# Patient Record
Sex: Male | Born: 1985 | Race: Black or African American | Hispanic: No | Marital: Single | Smoking: Current every day smoker
Health system: Southern US, Community
[De-identification: ages and names within clinical notes are randomized; demographics above are authoritative.]

---

## 2013-08-14 ENCOUNTER — Emergency Department: Payer: Self-pay | Admitting: Emergency Medicine

## 2013-10-12 ENCOUNTER — Emergency Department: Payer: Self-pay | Admitting: Emergency Medicine

## 2013-10-17 ENCOUNTER — Emergency Department: Payer: Self-pay | Admitting: Emergency Medicine

## 2013-10-18 ENCOUNTER — Emergency Department: Payer: Self-pay | Admitting: Emergency Medicine

## 2013-10-21 LAB — BETA STREP CULTURE(ARMC)

## 2013-11-14 ENCOUNTER — Emergency Department: Payer: Self-pay | Admitting: Emergency Medicine

## 2014-02-22 ENCOUNTER — Emergency Department: Payer: Self-pay | Admitting: Emergency Medicine

## 2014-07-12 ENCOUNTER — Emergency Department: Payer: Self-pay | Admitting: Emergency Medicine

## 2015-08-11 ENCOUNTER — Emergency Department
Admission: EM | Admit: 2015-08-11 | Discharge: 2015-08-11 | Disposition: A | Discharge status: Home / Self Care | Payer: Self-pay | Attending: Student | Admitting: Student

## 2015-08-11 ENCOUNTER — Encounter: Payer: Self-pay | Admitting: Emergency Medicine

## 2015-08-11 DIAGNOSIS — F17210 Nicotine dependence, cigarettes, uncomplicated: Secondary | ICD-10-CM | POA: Insufficient documentation

## 2015-08-11 DIAGNOSIS — K029 Dental caries, unspecified: Secondary | ICD-10-CM | POA: Insufficient documentation

## 2015-08-11 DIAGNOSIS — K0889 Other specified disorders of teeth and supporting structures: Secondary | ICD-10-CM | POA: Insufficient documentation

## 2015-08-11 MED ORDER — LIDOCAINE VISCOUS 2 % MT SOLN: 5.0000 mL | Freq: Four times a day (QID) | OROMUCOSAL | Status: AC | PRN

## 2015-08-11 MED ORDER — TRAMADOL HCL 50 MG PO TABS: 50.0000 mg | ORAL_TABLET | Freq: Four times a day (QID) | ORAL | Status: AC | PRN

## 2015-08-11 MED ORDER — IBUPROFEN 800 MG PO TABS
800.0000 mg | ORAL_TABLET | Freq: Once | ORAL | Status: AC
  Administered 2015-08-11: 800 mg via ORAL

## 2015-08-11 MED ORDER — IBUPROFEN 800 MG PO TABS
ORAL_TABLET | ORAL | Status: AC
  Filled 2015-08-11: qty 1

## 2015-08-11 MED ORDER — LIDOCAINE VISCOUS 2 % MT SOLN
15.0000 mL | Freq: Once | OROMUCOSAL | Status: AC
  Administered 2015-08-11: 15 mL via OROMUCOSAL
  Filled 2015-08-11: qty 15

## 2015-08-11 MED ORDER — AMOXICILLIN 500 MG PO CAPS: 500.0000 mg | ORAL_CAPSULE | Freq: Three times a day (TID) | ORAL | Status: AC

## 2015-08-11 NOTE — ED Notes (Signed)
Pt presents to ED with complaints of increasing dental pain, facial swelling over the last two days.  Pt reports hx of dental abscess, reports "whole in one of my teeth" point to upper right side.  Pt reports taking 800mg  ibuprofen for the pain, last taken at 0600 today.  Pt A/Ox4, vitals WDL, no immediate distress at this time.

## 2015-08-11 NOTE — ED Provider Notes (Signed)
[facility] Emergency Department Provider Note  ____________________________________________  Time seen: Approximately 3:18 PM  I have reviewed the triage vital signs and the nursing notes.   HISTORY  Chief Complaint Dental Pain and Facial Swelling    HPI Dashon Mcintire is a 30 y.o. male dental pain to the upper right premolar. Patient state he's had a year history dental pain to the area but increased in the last 2 days. No palliative measures taken for this complaint. Patient rates the pain as a 7/10. Patient described the pain as sharp. Patient state pain increase with eating.Patient denies any fever or swelling associated this complaint.   History reviewed. No pertinent past medical history.  There are no active problems to display for this patient.   History reviewed. No pertinent past surgical history.  Current Outpatient Rx  Name  Route  Sig  Dispense  Refill  . amoxicillin (AMOXIL) 500 MG capsule   Oral   Take 1 capsule (500 mg total) by mouth 3 (three) times daily.   30 capsule   0   . lidocaine (XYLOCAINE) 2 % solution   Mouth/Throat   Use as directed 5 mLs in the mouth or throat every 6 (six) hours as needed for mouth pain.   100 mL   0   . traMADol (ULTRAM) 50 MG tablet   Oral   Take 1 tablet (50 mg total) by mouth every 6 (six) hours as needed for moderate pain.   12 tablet   0     Allergies Review of patient's allergies indicates no known allergies.  No family history on file.  Social History Social History  Substance Use Topics  . Smoking status: Current Every Day Smoker    Types: Cigarettes  . Smokeless tobacco: None  . Alcohol Use: Yes     Comment: 2-3 days/week    Review of Systems Constitutional: No fever/chills Eyes: No visual changes. ENT: No sore throat. Dental pain Cardiovascular: Denies chest pain. Respiratory: Denies shortness of breath. Gastrointestinal: No abdominal pain.  No nausea, no  vomiting.  No diarrhea.  No constipation. Genitourinary: Negative for dysuria. Musculoskeletal: Negative for back pain. Skin: Negative for rash. Neurological: Negative for headaches, focal weakness or numbness.    ____________________________________________   PHYSICAL EXAM:  VITAL SIGNS: ED Triage Vitals  Enc Vitals Group     BP --      Pulse --      Resp --      Temp --      Temp src --      SpO2 --      Weight --      Height --      Head Cir --      Peak Flow --      Pain Score --      Pain Loc --      Pain Edu? --      Excl. in GC? --     Constitutional: Alert and oriented. Well appearing and in no acute distress. Eyes: Conjunctivae are normal. PERRL. EOMI. Head: Atraumatic. Nose: No congestion/rhinnorhea. Mouth/Throat: Mucous membranes are moist.  Oropharynx non-erythematous. Dental caries at tooth #10. Mildly edema to the gingiva. No abscess. Neck: No stridor. No cervical spine tenderness to palpation. Hematological/Lymphatic/Immunilogical: No cervical lymphadenopathy. Cardiovascular: Normal rate, regular rhythm. Grossly normal heart sounds.  Good peripheral circulation. Respiratory: Normal respiratory effort.  No retractions. Lungs CTAB. Gastrointestinal: Soft and nontender. No distention. No abdominal bruits. No CVA tenderness.  Musculoskeletal: No lower extremity tenderness nor edema.  No joint effusions. Neurologic:  Normal speech and language. No gross focal neurologic deficits are appreciated. No gait instability. Skin:  Skin is warm, dry and intact. No rash noted. Psychiatric: Mood and affect are normal. Speech and behavior are normal.  ____________________________________________   LABS (all labs ordered are listed, but only abnormal results are displayed)  Labs Reviewed - No data to  display ____________________________________________  EKG   ____________________________________________  RADIOLOGY   ____________________________________________   PROCEDURES  Procedure(s) performed: None  Critical Care performed: No  ____________________________________________   INITIAL IMPRESSION / ASSESSMENT AND PLAN / ED COURSE  Pertinent labs & imaging results that were available during my care of the patient were reviewed by me and considered in my medical decision making (see chart for details).  Dental pain secondary to cavity.  She given discharge care instructions. Patient given a list of dental clinics for follow-up care. Patient given prescription for amoxicillin, tramadol, and viscous lidocaine. ____________________________________________   FINAL CLINICAL IMPRESSION(S) / ED DIAGNOSES  Final diagnoses:  Pain due to dental caries       Joni Reiningonald K Smith, PA-C 08/11/15 1531  Gayla DossEryka A Gayle, MD 08/11/15 2349

## 2015-08-11 NOTE — Discharge Instructions (Signed)
Follow-up with list of dental clinics provided. °OPTIONS FOR DENTAL FOLLOW UP CARE ° °[place] Department of Health and Human Services - Local Safety Net Dental Clinics °http://www.ncdhhs.gov/dph/oralhealth/services/safetynetclinics.htm °  °[facility] (336-562-3123) ° °[facility] [place] (919-933-9087) ° °[facility] (919-663-1744 ext [phone]) ° °[facility] ([phone]) ° °[facility] ([phone]) °This clinic caters to the indigent population and is on a lottery system. °Location: °[facility], [address], [place] °Clinic Hours: °Wednesdays from 6pm - 9pm, patients seen by a lottery system. °For dates, call or go to www.med.[facility].edu/[facility]/patients/Dental-[facility] °Services: °Cleanings, fillings and simple extractions. °Payment Options: °DENTAL WORK IS FREE OF CHARGE. Bring proof of income or support. °Best way to get seen: °Arrive at 5:15 pm - this is a lottery, NOT first come/first serve, so arriving earlier will not increase your chances of being seen. °  °  °[facility] °[phone] °Select option 1 for emergencies °  °Location: °[facility], [address], [place] °Clinic Hours: °No walk-ins accepted - call the day before to schedule an appointment. °Check in times are 9:30 am and 1:30 pm. °Services: °Simple extractions, temporary fillings, pulpectomy/pulp debridement, uncomplicated abscess drainage. °Payment Options: °PAYMENT IS DUE AT THE TIME OF SERVICE.  Fee is usually $100-200, additional surgical procedures (e.g. abscess drainage) may be extra. °Cash, checks, Visa/MasterCard accepted.  Can file Medicaid if patient is covered for dental - patient should call case worker to check. °No discount for [facility] patients. °Best way to get seen: °MUST call the day before and get onto the schedule. Can usually be seen the next 1-2 days. No walk-ins accepted. °  °   °[facility] °[phone] °  °Location: °[facility], [address], [place] °Clinic Hours: °M, W, Th, F 8am or 1:30pm, Tues 9a or 1:30 - first come/first served. °Services: °Simple extractions, temporary fillings, uncomplicated abscess drainage.  You do not need to be an [place] resident. °Payment Options: °PAYMENT IS DUE AT THE TIME OF SERVICE. °Dental insurance, otherwise sliding scale - bring proof of income or support. °Depending on income and treatment needed, cost is usually $50-200. °Best way to get seen: °Arrive early as it is first come/first served. °  °  °[facility] °[phone] °  °Location: °[address] °Clinic Hours: °Mon-Thu 8a-5p °Services: °Most basic dental services including extractions and fillings. °Payment Options: °PAYMENT IS DUE AT THE TIME OF SERVICE. °Sliding scale, up to 50% off - bring proof if income or support. °Medicaid with dental option accepted. °Best way to get seen: °Call to schedule an appointment, can usually be seen within 2 weeks OR they will try to see walk-ins - show up at 8a or 2p (you may have to wait). °  °  °[facility] °919-245-2435 °[place] RESIDENTS ONLY °  °Location: °[facility], [address], [place], [place] [address] °Clinic Hours: By appointment only. °Monday - Thursday 8am-5pm, Friday 8am-12pm °Services: Cleanings, fillings, extractions. °Payment Options: °PAYMENT IS DUE AT THE TIME OF SERVICE. °Cash, Visa or MasterCard. Sliding scale - $30 minimum per service. °Best way to get seen: °Come in to office, complete packet and make an appointment - need proof of income °or support monies for each household member and proof of [place] residence. °Usually takes about a month to get in. °  °  °[facility] °  [phone] °  °Location: °[address] [place] °Clinic Hours: Walk-in Urgent Care  Dental Services are offered Monday-Friday mornings only. °The numbers of emergencies accepted daily is limited to the number of °providers available. °Maximum 15 - Mondays, Wednesdays & Thursdays °Maximum 10 - Tuesdays & Fridays °Services: °You do not need to be a [place] resident to be seen for a dental emergency. °Emergencies are defined as pain, swelling, abnormal bleeding, or dental trauma. Walkins will receive x-rays if needed. °NOTE: Dental cleaning is not an emergency. °Payment Options: °PAYMENT IS DUE AT THE TIME OF SERVICE. °Minimum co-pay is $40.00 for uninsured patients. °Minimum co-pay is $3.00 for Medicaid with dental coverage. °Dental Insurance is accepted and must be presented at time of visit. °Medicare does not cover dental. °Forms of payment: Cash, credit card, checks. °Best way to get seen: °If not previously registered with the clinic, walk-in dental registration begins at 7:15 am and is on a first come/first serve basis. °If previously registered with the clinic, call to make an appointment. °  °  °[facility] °[phone] °[place] RESIDENTS ONLY °  °Location: °[address], [place], [place] °Clinic Hours: °Mon-Thu 10a-2p °Services: Extractions only! °Payment Options: °FREE (donations accepted) - bring proof of income or support °Best way to get seen: °Call and schedule an appointment OR come at 8am on the 1st Monday of every month (except for holidays) when it is first come/first served. °  °  °[facility] °[phone] °  °Location: °[address],  °Clinic Hours: °Friday mornings °Services, Payment Options, Best way to get seen: °Call for info ° °

## 2016-04-08 ENCOUNTER — Emergency Department
Admission: EM | Admit: 2016-04-08 | Discharge: 2016-04-08 | Disposition: A | Discharge status: Home / Self Care | Payer: Self-pay | Attending: Emergency Medicine | Admitting: Emergency Medicine

## 2016-04-08 ENCOUNTER — Encounter: Payer: Self-pay | Admitting: Emergency Medicine

## 2016-04-08 DIAGNOSIS — Z792 Long term (current) use of antibiotics: Secondary | ICD-10-CM | POA: Insufficient documentation

## 2016-04-08 DIAGNOSIS — Z79899 Other long term (current) drug therapy: Secondary | ICD-10-CM | POA: Insufficient documentation

## 2016-04-08 DIAGNOSIS — M25561 Pain in right knee: Secondary | ICD-10-CM | POA: Insufficient documentation

## 2016-04-08 DIAGNOSIS — K0889 Other specified disorders of teeth and supporting structures: Secondary | ICD-10-CM | POA: Insufficient documentation

## 2016-04-08 DIAGNOSIS — F17210 Nicotine dependence, cigarettes, uncomplicated: Secondary | ICD-10-CM | POA: Insufficient documentation

## 2016-04-08 MED ORDER — LIDOCAINE VISCOUS 2 % MT SOLN
15.0000 mL | Freq: Once | OROMUCOSAL | Status: AC
  Administered 2016-04-08: 15 mL via OROMUCOSAL
  Filled 2016-04-08: qty 15

## 2016-04-08 NOTE — ED Notes (Signed)
Pt discharged to home.  Family member driving.  Discharge instructions reviewed.  Verbalized understanding.  No questions or concerns at this time.  Teach back verified.  Pt in NAD.  No items left in ED.   

## 2016-04-08 NOTE — ED Triage Notes (Addendum)
Patient ambulatory to triage with steady gait, without difficulty or distress noted; pt reports "I have a torn meniscus and I'm here to see it is healed"

## 2016-04-08 NOTE — ED Notes (Addendum)
Pt states that he had a torn meniscus in R knee in 2015 and that it was healed according to ortho.  Pt states he's been wearing leg brace off and on since 2015.  Pt reports it has been more irritated lately with shooting pain.  Pt also reports having dental pain at this time.  Pt ambulatory to commode upon this RN and EDP arrival into room.

## 2016-04-08 NOTE — ED Provider Notes (Signed)
[facility] Emergency Department Provider Note    First MD Initiated Contact with Patient 04/08/16 0151     (approximate)  I have reviewed the triage vital signs and the nursing notes.   HISTORY  Chief Complaint Leg Pain    HPI Paul Jennings is a 30 y.o. male presents to the emergency department stating that "my brother just had a baby so I figured I might as well go down and get my knee checked out". Patient states he had a right lateral meniscus tear in 2014 and occasionally has pain in that area and a such as concerned of a meniscus tear has healed. In addition the patient admits to intermittent toothache none at present.   Past medical history Right lateral meniscus tear There are no active problems to display for this patient.   Past surgical history None  Prior to Admission medications   Medication Sig Start Date End Date Taking? Authorizing Provider  amoxicillin (AMOXIL) 500 MG capsule Take 1 capsule (500 mg total) by mouth 3 (three) times daily. 08/11/15   Joni Reiningonald K Smith, PA-C  lidocaine (XYLOCAINE) 2 % solution Use as directed 5 mLs in the mouth or throat every 6 (six) hours as needed for mouth pain. 08/11/15   Joni Reiningonald K Smith, PA-C  traMADol (ULTRAM) 50 MG tablet Take 1 tablet (50 mg total) by mouth every 6 (six) hours as needed for moderate pain. 08/11/15   Joni Reiningonald K Smith, PA-C    Allergies No known drug allergies No family history on file.  Social History Social History  Substance Use Topics  . Smoking status: Current Every Day Smoker    Types: Cigarettes  . Smokeless tobacco: Never Used  . Alcohol use Yes     Comment: 2-3 days/week    Review of Systems Constitutional: No fever/chills Eyes: No visual changes. ENT: No sore throat. Cardiovascular: Denies chest pain. Respiratory: Denies shortness of breath. Gastrointestinal: No abdominal pain.  No nausea, no vomiting.  No diarrhea.  No constipation. Genitourinary:  Negative for dysuria. Musculoskeletal: Negative for back pain. Positive for right knee pain Skin: Negative for rash. Neurological: Negative for headaches, focal weakness or numbness.  10-point ROS otherwise negative.  ____________________________________________   PHYSICAL EXAM:  VITAL SIGNS: ED Triage Vitals  Enc Vitals Group     BP 04/08/16 0103 136/83     Pulse Rate 04/08/16 0103 91     Resp 04/08/16 0103 20     Temp 04/08/16 0103 98 F (36.7 C)     Temp Source 04/08/16 0103 Oral     SpO2 04/08/16 0103 97 %     Weight 04/08/16 0103 141 lb (64 kg)     Height 04/08/16 0103 5\' 7"  (1.702 m)     Head Circumference --      Peak Flow --      Pain Score 04/08/16 0104 5     Pain Loc --      Pain Edu? --      Excl. in GC? --     Constitutional: Alert and oriented. Well appearing and in no acute distress. Eyes: Conjunctivae are normal. PERRL. EOMI. Head: Atraumatic. Mouth/Throat: Mucous membranes are moist.  Oropharynx non-erythematous.Right maxillary premolar dental caies Neck: No stridor. Cardiovascular: Normal rate, regular rhythm. Good peripheral circulation. Grossly normal heart sounds. Respiratory: Normal respiratory effort.  No retractions. Lungs CTAB. Gastrointestinal: Soft and nontender. No distention.  Musculoskeletal: No lower extremity tenderness nor edema. No gross deformities of extremities. No pain  with active and passive range of motion of the right knee. No pain with palpation of the right knee. No fluid collection appreciated. Neurologic:  Normal speech and language. No gross focal neurologic deficits are appreciated.  Skin:  Skin is warm, dry and intact. No rash noted. Psychiatric: Mood and affect are normal. Speech and behavior are normal.   Procedures     INITIAL IMPRESSION / ASSESSMENT AND PLAN / ED COURSE  Pertinent labs & imaging results that were available during my care of the patient were reviewed by me and considered in my medical decision  making (see chart for details).     Clinical Course     ____________________________________________  FINAL CLINICAL IMPRESSION(S) / ED DIAGNOSES  Final diagnoses:  Acute pain of right knee  Toothache     MEDICATIONS GIVEN DURING THIS VISIT:  Medications - No data to display   NEW OUTPATIENT MEDICATIONS STARTED DURING THIS VISIT:  New Prescriptions   No medications on file    Modified Medications   No medications on file    Discontinued Medications   No medications on file     Note:  This document was prepared using Dragon voice recognition software and may include unintentional dictation errors.    Darci Currentandolph N Brown, MD 04/08/16 [id number]

## 2016-11-03 ENCOUNTER — Emergency Department
Admission: EM | Admit: 2016-11-03 | Discharge: 2016-11-03 | Disposition: A | Discharge status: Home / Self Care | Payer: Self-pay | Attending: Emergency Medicine | Admitting: Emergency Medicine

## 2016-11-03 ENCOUNTER — Encounter: Payer: Self-pay | Admitting: Emergency Medicine

## 2016-11-03 ENCOUNTER — Emergency Department: Payer: Self-pay

## 2016-11-03 DIAGNOSIS — J40 Bronchitis, not specified as acute or chronic: Secondary | ICD-10-CM | POA: Insufficient documentation

## 2016-11-03 DIAGNOSIS — F17210 Nicotine dependence, cigarettes, uncomplicated: Secondary | ICD-10-CM | POA: Insufficient documentation

## 2016-11-03 DIAGNOSIS — Z825 Family history of asthma and other chronic lower respiratory diseases: Secondary | ICD-10-CM | POA: Insufficient documentation

## 2016-11-03 LAB — BASIC METABOLIC PANEL
Anion gap: 10 (ref 5–15)
BUN: 11 mg/dL (ref 6–20)
CHLORIDE: 105 mmol/L (ref 101–111)
CO2: 26 mmol/L (ref 22–32)
CREATININE: 0.71 mg/dL (ref 0.61–1.24)
Calcium: 8.9 mg/dL (ref 8.9–10.3)
GFR calc Af Amer: >60 mL/min (ref >60)
GFR calc non Af Amer: >60 mL/min (ref >60)
GLUCOSE: 106 mg/dL — AB (ref 65–99)
Potassium: 4 mmol/L (ref 3.5–5.1)
Sodium: 141 mmol/L (ref 135–145)

## 2016-11-03 LAB — URINALYSIS, COMPLETE (UACMP) WITH MICROSCOPIC
BACTERIA UA: NONE SEEN
BILIRUBIN URINE: NEGATIVE
Glucose, UA: NEGATIVE mg/dL
Hgb urine dipstick: NEGATIVE
KETONES UR: NEGATIVE mg/dL
LEUKOCYTES UA: NEGATIVE
Nitrite: NEGATIVE
Protein, ur: NEGATIVE mg/dL
RBC / HPF: NONE SEEN RBC/hpf (ref 0–5)
Specific Gravity, Urine: 1.012 (ref 1.005–1.030)
WBC, UA: NONE SEEN WBC/hpf (ref 0–5)
pH: 5 (ref 5.0–8.0)

## 2016-11-03 LAB — CBC
HCT: 43.8 % (ref 40.0–52.0)
Hemoglobin: 15.1 g/dL (ref 13.0–18.0)
MCH: 34.4 pg — AB (ref 26.0–34.0)
MCHC: 34.5 g/dL (ref 32.0–36.0)
MCV: 99.9 fL (ref 80.0–100.0)
Platelets: 188 10*3/uL (ref 150–440)
RBC: 4.39 MIL/uL — AB (ref 4.40–5.90)
RDW: 13.2 % (ref 11.5–14.5)
WBC: 5.2 10*3/uL (ref 3.8–10.6)

## 2016-11-03 LAB — TROPONIN I: Troponin I: <0.03 ng/mL (ref <0.03)

## 2016-11-03 MED ORDER — ALBUTEROL SULFATE HFA 108 (90 BASE) MCG/ACT IN AERS: 2.0000 | INHALATION_SPRAY | Freq: Four times a day (QID) | RESPIRATORY_TRACT | 2 refills | Status: AC | PRN

## 2016-11-03 MED ORDER — IPRATROPIUM-ALBUTEROL 0.5-2.5 (3) MG/3ML IN SOLN
3.0000 mL | Freq: Once | RESPIRATORY_TRACT | Status: AC
Start: 2016-11-03 — End: 2016-11-03
  Administered 2016-11-03: 3 mL via RESPIRATORY_TRACT
  Filled 2016-11-03: qty 3

## 2016-11-03 NOTE — ED Notes (Signed)
E-signature pad not functional. 

## 2016-11-03 NOTE — ED Provider Notes (Signed)
[facility] Emergency Department Provider Note  ____________________________________________  Time seen: Approximately 9:45 PM  I have reviewed the triage vital signs and the nursing notes.   HISTORY  Chief Complaint Chest Pain; Cough; and Dizziness   HPI Paul Jennings is a 31 y.o. male with a history of smoking or family history of asthma who presents for evaluation of chest tightness, shortness of breath, cough productive of yellow sputum that has been going on for 1.5 months. Patient reports that today he had a more severe episode of chest tightness and shortness of breath which prompted his visit to the emergency room. Patient denies wheezing. The tightness is located in the center of his chest, mild, nonradiating and only present when he feels short of breath. No pleuritic chest pain. He has had a cough productive of yellow sputum for the same time. No fever or chills, no nausea or vomiting, no diarrhea. No personal or family history of ischemic heart disease or blood clots, no recent travel or immobilization, no leg pain or swelling, no hemoptysis, no exogenous hormones.  History reviewed. No pertinent past medical history.  There are no active problems to display for this patient.   History reviewed. No pertinent surgical history.  Prior to Admission medications   Medication Sig Start Date End Date Taking? Authorizing Provider  albuterol (PROVENTIL HFA;VENTOLIN HFA) 108 (90 Base) MCG/ACT inhaler Inhale 2 puffs into the lungs every 6 (six) hours as needed for wheezing or shortness of breath. 11/03/16   Nita Sickle, MD  amoxicillin (AMOXIL) 500 MG capsule Take 1 capsule (500 mg total) by mouth 3 (three) times daily. 08/11/15   Joni Reining, PA-C  lidocaine (XYLOCAINE) 2 % solution Use as directed 5 mLs in the mouth or throat every 6 (six) hours as needed for mouth pain. 08/11/15   Joni Reining, PA-C  traMADol (ULTRAM) 50 MG tablet  Take 1 tablet (50 mg total) by mouth every 6 (six) hours as needed for moderate pain. 08/11/15   Joni Reining, PA-C    Allergies Patient has no known allergies.  No family history on file.  Social History Social History  Substance Use Topics  . Smoking status: Current Every Day Smoker    Types: Cigarettes  . Smokeless tobacco: Never Used  . Alcohol use Yes     Comment: 2-3 days/week    Review of Systems  Constitutional: Negative for fever. Eyes: Negative for visual changes. ENT: Negative for sore throat. Neck: No neck pain  Cardiovascular: + chest tightness. Respiratory: + shortness of breath and cough Gastrointestinal: Negative for abdominal pain, vomiting or diarrhea. Genitourinary: Negative for dysuria. Musculoskeletal: Negative for back pain. Skin: Negative for rash. Neurological: Negative for headaches, weakness or numbness. Psych: No SI or HI  ____________________________________________   PHYSICAL EXAM:  VITAL SIGNS: ED Triage Vitals [11/03/16 1906]  Enc Vitals Group     BP 128/71     Pulse Rate 86     Resp 18     Temp 97.8 F (36.6 C)     Temp Source Oral     SpO2 95 %     Weight 145 lb (65.8 kg)     Height 5\' 7"  (1.702 m)     Head Circumference      Peak Flow      Pain Score      Pain Loc      Pain Edu?      Excl. in GC?  Constitutional: Alert and oriented. Well appearing and in no apparent distress. HEENT:      Head: Normocephalic and atraumatic.         Eyes: Conjunctivae are normal. Sclera is non-icteric.       Mouth/Throat: Mucous membranes are moist.       Neck: Supple with no signs of meningismus. Cardiovascular: Regular rate and rhythm. No murmurs, gallops, or rubs. 2+ symmetrical distal pulses are present in all extremities. No JVD. Respiratory: Normal respiratory effort. Lungs are clear to auscultation bilaterally. No wheezes, crackles, or rhonchi.  Gastrointestinal: Soft, non tender, and non distended with positive bowel  sounds. No rebound or guarding. Genitourinary: No CVA tenderness. Musculoskeletal: Nontender with normal range of motion in all extremities. No edema, cyanosis, or erythema of extremities. Neurologic: Normal speech and language. Face is symmetric. Moving all extremities. No gross focal neurologic deficits are appreciated. Skin: Skin is warm, dry and intact. No rash noted. Psychiatric: Mood and affect are normal. Speech and behavior are normal.  ____________________________________________   LABS (all labs ordered are listed, but only abnormal results are displayed)  Labs Reviewed  BASIC METABOLIC PANEL - Abnormal; Notable for the following:       Result Value   Glucose, Bld 106 (*)    All other components within normal limits  CBC - Abnormal; Notable for the following:    RBC 4.39 (*)    MCH 34.4 (*)    All other components within normal limits  URINALYSIS, COMPLETE (UACMP) WITH MICROSCOPIC - Abnormal; Notable for the following:    Color, Urine YELLOW (*)    APPearance CLEAR (*)    Squamous Epithelial / LPF 0-5 (*)    All other components within normal limits  TROPONIN I   ____________________________________________  EKG  ED ECG REPORT I, Nita Sicklearolina Kiri Hinderliter, the attending physician, personally viewed and interpreted this ECG.  Normal sinus rhythm, rate of 75, normal intervals, normal axis, benign early repolarization, no ST elevations or depressions.  ____________________________________________  RADIOLOGY  CXR: negative ____________________________________________   PROCEDURES  Procedure(s) performed: None Procedures Critical Care performed:  None ____________________________________________   INITIAL IMPRESSION / ASSESSMENT AND PLAN / ED COURSE  31 y.o. male with a history of smoking or family history of asthma who presents for evaluation of chest tightness, shortness of breath, cough productive of yellow sputum that has been going on for 1.5 months. Patient  extremely well appearing, normal work of breathing, normal sats, lungs are clear to auscultation. Chest x-ray with no acute findings. EKG and troponin negative. Patient received 1 DuoNeb treatment with full resolution of his symptoms. Patient diagnosed bronchitis and sent home with albuterol. Counseling provided to smoking cessation. Patient to follow up with his primary care doctor.     Pertinent labs & imaging results that were available during my care of the patient were reviewed by me and considered in my medical decision making (see chart for details).    ____________________________________________   FINAL CLINICAL IMPRESSION(S) / ED DIAGNOSES  Final diagnoses:  Bronchitis      NEW MEDICATIONS STARTED DURING THIS VISIT:  Discharge Medication List as of 11/03/2016 10:17 PM    START taking these medications   Details  albuterol (PROVENTIL HFA;VENTOLIN HFA) 108 (90 Base) MCG/ACT inhaler Inhale 2 puffs into the lungs every 6 (six) hours as needed for wheezing or shortness of breath., Starting Thu 11/03/2016, Print         Note:  This document was prepared using Dragon voice recognition software  and may include unintentional dictation errors.    Don Perking, [place], MD 11/03/16 [phone]

## 2016-11-03 NOTE — ED Triage Notes (Signed)
Patient ambulatory to triage with steady gait, without difficulty or distress noted, pt reports SHOB last several days with prod cough yellow sputum, mid CP radiating into clavicle x 2wks with dizziness and "sleeping a lot"

## 2016-11-03 NOTE — ED Notes (Signed)
First nurse note- pt arrives via EMS, states CP and numbness in his hands for 1.5 months, states "life happens", states hx of anxiety, per EMS vitals WDL

## 2018-06-20 ENCOUNTER — Encounter: Payer: Self-pay | Admitting: Emergency Medicine

## 2018-06-20 ENCOUNTER — Emergency Department
Admission: EM | Admit: 2018-06-20 | Discharge: 2018-06-20 | Payer: Self-pay | Attending: Emergency Medicine | Admitting: Emergency Medicine

## 2018-06-20 DIAGNOSIS — R45851 Suicidal ideations: Secondary | ICD-10-CM | POA: Insufficient documentation

## 2018-06-20 DIAGNOSIS — F332 Major depressive disorder, recurrent severe without psychotic features: Secondary | ICD-10-CM | POA: Insufficient documentation

## 2018-06-20 DIAGNOSIS — Z79899 Other long term (current) drug therapy: Secondary | ICD-10-CM | POA: Insufficient documentation

## 2018-06-20 DIAGNOSIS — F321 Major depressive disorder, single episode, moderate: Secondary | ICD-10-CM

## 2018-06-20 DIAGNOSIS — F329 Major depressive disorder, single episode, unspecified: Secondary | ICD-10-CM | POA: Diagnosis present

## 2018-06-20 DIAGNOSIS — F32A Depression, unspecified: Secondary | ICD-10-CM | POA: Diagnosis present

## 2018-06-20 DIAGNOSIS — F17210 Nicotine dependence, cigarettes, uncomplicated: Secondary | ICD-10-CM | POA: Insufficient documentation

## 2018-06-20 LAB — CBC
HEMATOCRIT: 46.4 % (ref 39.0–52.0)
HEMOGLOBIN: 15.8 g/dL (ref 13.0–17.0)
MCH: 33.1 pg (ref 26.0–34.0)
MCHC: 34.1 g/dL (ref 30.0–36.0)
MCV: 97.3 fL (ref 80.0–100.0)
Platelets: 217 10*3/uL (ref 150–400)
RBC: 4.77 MIL/uL (ref 4.22–5.81)
RDW: 12.6 % (ref 11.5–15.5)
WBC: 4.3 10*3/uL (ref 4.0–10.5)
nRBC: 0 % (ref 0.0–0.2)

## 2018-06-20 LAB — COMPREHENSIVE METABOLIC PANEL
ALK PHOS: 75 U/L (ref 38–126)
ALT: 31 U/L (ref 0–44)
ANION GAP: 10 (ref 5–15)
AST: 51 U/L — AB (ref 15–41)
Albumin: 5.1 g/dL — ABNORMAL HIGH (ref 3.5–5.0)
BUN: 11 mg/dL (ref 6–20)
CALCIUM: 9.2 mg/dL (ref 8.9–10.3)
CO2: 29 mmol/L (ref 22–32)
Chloride: 100 mmol/L (ref 98–111)
Creatinine, Ser: 0.85 mg/dL (ref 0.61–1.24)
GFR calc Af Amer: >60 mL/min (ref >60)
GFR calc non Af Amer: >60 mL/min (ref >60)
GLUCOSE: 120 mg/dL — AB (ref 70–99)
Potassium: 4.2 mmol/L (ref 3.5–5.1)
Sodium: 139 mmol/L (ref 135–145)
TOTAL PROTEIN: 9.5 g/dL — AB (ref 6.5–8.1)
Total Bilirubin: 1 mg/dL (ref 0.3–1.2)

## 2018-06-20 LAB — ETHANOL: Alcohol, Ethyl (B): 339 mg/dL (ref <10)

## 2018-06-20 LAB — URINE DRUG SCREEN, QUALITATIVE (ARMC ONLY)
Amphetamines, Ur Screen: NOT DETECTED
BARBITURATES, UR SCREEN: NOT DETECTED
Benzodiazepine, Ur Scrn: NOT DETECTED
COCAINE METABOLITE, UR ~~LOC~~: NOT DETECTED
Cannabinoid 50 Ng, Ur ~~LOC~~: NOT DETECTED
MDMA (ECSTASY) UR SCREEN: NOT DETECTED
METHADONE SCREEN, URINE: NOT DETECTED
OPIATE, UR SCREEN: NOT DETECTED
Phencyclidine (PCP) Ur S: NOT DETECTED
TRICYCLIC, UR SCREEN: NOT DETECTED

## 2018-06-20 LAB — SALICYLATE LEVEL: Salicylate Lvl: <7 mg/dL (ref 2.8–30.0)

## 2018-06-20 LAB — ACETAMINOPHEN LEVEL

## 2018-06-20 MED ORDER — FLUOXETINE HCL 20 MG PO CAPS: 20.0000 mg | ORAL_CAPSULE | Freq: Every day | ORAL | 0 refills | Status: AC

## 2018-06-20 MED ORDER — FLUOXETINE HCL 20 MG PO CAPS
20.0000 mg | ORAL_CAPSULE | Freq: Once | ORAL | Status: AC
  Administered 2018-06-20: 20 mg via ORAL
  Filled 2018-06-20: qty 1

## 2018-06-20 MED ORDER — IBUPROFEN 600 MG PO TABS
600.0000 mg | ORAL_TABLET | Freq: Once | ORAL | Status: AC
  Administered 2018-06-20: 600 mg via ORAL
  Filled 2018-06-20: qty 1

## 2018-06-20 NOTE — ED Triage Notes (Signed)
Patient presents to the ED for suicidal ideation in police custody.  Per officer, patient will need to remain in police custody while in the ED.  Patient states his mother has stage 4 cancer and lives in [place].  Patient states his mother was visiting but left 1 week ago and patient is now feeling very upset.  Patient states, "I tried to use a butcher knife last time, but if I had a gun I would have did it.  I don't ever have thoughts about hurting nobody else, just me."  Patient states he has reached out to friends, but they think he's not serious. Patient states "I cried to much today."  Patient reports headache and stress.

## 2018-06-20 NOTE — ED Provider Notes (Signed)
[facility] Emergency Department Provider Note  ____________________________________________  Time seen: Approximately 3:33 PM  I have reviewed the triage vital signs and the nursing notes.   HISTORY  Chief Complaint No chief complaint on file.   HPI Paul Jennings is a 33 y.o. male no significant past medical history who presents IVC for suicidal ideation and depression.  Patient reports that his mother lives in [place], she is currently battling stage IV cancer.  She was here over the holidays and just left back to her home.  Patient reports that he misses his mother and feels very depressed when she is not around.  He has been having thoughts over the last 2 months of killing himself.  Today he was thinking about stabbing himself when he called his mother and she urged him to call 911 stand for help with.  Patient comes in IVC by police.  Patient reports several prior episodes of suicidal ideation but never attempts.  He reports that he uses marijuana and drinks alcohol every day.  Denies any other drug use.  Denies any diagnosis of depression, has never been on medications for it.  His symptoms are severe and constant.   PMH none  Prior to Admission medications   Medication Sig Start Date End Date Taking? Authorizing Provider  albuterol (PROVENTIL HFA;VENTOLIN HFA) 108 (90 Base) MCG/ACT inhaler Inhale 2 puffs into the lungs every 6 (six) hours as needed for wheezing or shortness of breath. 11/03/16   Nita Sickle, MD  amoxicillin (AMOXIL) 500 MG capsule Take 1 capsule (500 mg total) by mouth 3 (three) times daily. 08/11/15   Joni Reining, PA-C  FLUoxetine (PROZAC) 20 MG capsule Take 1 capsule (20 mg total) by mouth daily. 06/20/18 06/20/19  Nita Sickle, MD  lidocaine (XYLOCAINE) 2 % solution Use as directed 5 mLs in the mouth or throat every 6 (six) hours as needed for mouth pain. 08/11/15   Joni Reining, PA-C  traMADol (ULTRAM) 50  MG tablet Take 1 tablet (50 mg total) by mouth every 6 (six) hours as needed for moderate pain. 08/11/15   Joni Reining, PA-C    Allergies Patient has no known allergies.  FH Mother - cancer  Social History Social History   Tobacco Use  . Smoking status: Current Every Day Smoker    Types: Cigarettes  . Smokeless tobacco: Never Used  Substance Use Topics  . Alcohol use: Yes    Comment: 2-3 days/week  . Drug use: No    Review of Systems  Constitutional: Negative for fever. Eyes: Negative for visual changes. ENT: Negative for sore throat. Neck: No neck pain  Cardiovascular: Negative for chest pain. Respiratory: Negative for shortness of breath. Gastrointestinal: Negative for abdominal pain, vomiting or diarrhea. Genitourinary: Negative for dysuria. Musculoskeletal: Negative for back pain. Skin: Negative for rash. Neurological: Negative for headaches, weakness or numbness. Psych: + depression and SI. No HI  ____________________________________________   PHYSICAL EXAM:  VITAL SIGNS: ED Triage Vitals [06/20/18 1510]  Enc Vitals Group     BP (!) 154/85     Pulse Rate 89     Resp 20     Temp 98.8 F (37.1 C)     Temp Source Oral     SpO2 97 %     Weight      Height      Head Circumference      Peak Flow      Pain Score  Pain Loc      Pain Edu?      Excl. in GC?     Constitutional: Alert and oriented. Well appearing and in no apparent distress. HEENT:      Head: Normocephalic and atraumatic.         Eyes: Conjunctivae are normal. Sclera is non-icteric.       Mouth/Throat: Mucous membranes are moist.       Neck: Supple with no signs of meningismus. Cardiovascular: Regular rate and rhythm. No murmurs, gallops, or rubs. 2+ symmetrical distal pulses are present in all extremities. No JVD. Respiratory: Normal respiratory effort. Lungs are clear to auscultation bilaterally. No wheezes, crackles, or rhonchi.  Gastrointestinal: Soft, non tender, and non  distended with positive bowel sounds. No rebound or guarding. Musculoskeletal: Nontender with normal range of motion in all extremities. No edema, cyanosis, or erythema of extremities. Neurologic: Normal speech and language. Face is symmetric. Moving all extremities. No gross focal neurologic deficits are appreciated. Skin: Skin is warm, dry and intact. No rash noted. Psychiatric: Mood and affect are normal. Speech and behavior are normal.  ____________________________________________   LABS (all labs ordered are listed, but only abnormal results are displayed)  Labs Reviewed  COMPREHENSIVE METABOLIC PANEL - Abnormal; Notable for the following components:      Result Value   Glucose, Bld 120 (*)    Total Protein 9.5 (*)    Albumin 5.1 (*)    AST 51 (*)    All other components within normal limits  ETHANOL - Abnormal; Notable for the following components:   Alcohol, Ethyl (B) 339 (*)    All other components within normal limits  ACETAMINOPHEN LEVEL - Abnormal; Notable for the following components:   Acetaminophen (Tylenol), Serum <10 (*)    All other components within normal limits  SALICYLATE LEVEL  CBC  URINE DRUG SCREEN, QUALITATIVE ([facility] ONLY)   ____________________________________________  EKG  none  ____________________________________________  RADIOLOGY  none  ____________________________________________   PROCEDURES  Procedure(s) performed: None Procedures Critical Care performed:  None ____________________________________________   INITIAL IMPRESSION / ASSESSMENT AND PLAN / ED COURSE  33 y.o. male no significant past medical history who presents IVC for suicidal ideation and depression.  Patient presents with the PD under IVC for suicidal ideation with a plan of stabbing himself.  Will maintain IVC papers.  No medical complaints.  Labs for medical clearance are pending.  Will consult psychiatry    _________________________ 5:57 PM on  06/20/2018 -----------------------------------------  Labs for medical clearance with no significant findings other than positive alcohol level of 339.  Patient is clinically sober.  Patient was evaluated by Dr. Viviano SimasMaurer from psychiatry and cleared for discharge.  Patient will be discharged to the care of police officer since he is currently under arrest.   As part of my medical decision making, I reviewed the following data within the electronic MEDICAL RECORD NUMBER Nursing notes reviewed and incorporated, Labs reviewed , Old chart reviewed, A consult was requested and obtained from this/these consultant(s) Psychiatry, Notes from prior ED visits and [place] Controlled Substance Database    Pertinent labs & imaging results that were available during my care of the patient were reviewed by me and considered in my medical decision making (see chart for details).    ____________________________________________   FINAL CLINICAL IMPRESSION(S) / ED DIAGNOSES  Final diagnoses:  Severe episode of recurrent major depressive disorder, without psychotic features (HCC)  Suicidal ideation      NEW MEDICATIONS STARTED  DURING THIS VISIT:  ED Discharge Orders         Ordered    FLUoxetine (PROZAC) 20 MG capsule  Daily     06/20/18 1756           Note:  This document was prepared using Dragon voice recognition software and may include unintentional dictation errors.    Don Perking, [place], MD 06/20/18 1800

## 2018-06-20 NOTE — ED Notes (Signed)
Pt discharged in police custody.  VS stable. Discharge paperwork and belongings given to officers.

## 2018-06-20 NOTE — Consult Note (Signed)
[facility] Face-to-Face Psychiatry Consult   Reason for Consult:  Depression with suicidal thoughts Referring Physician:  Dr. Don Perking Patient Identification: Paul Jennings MRN:  [id number] Principal Diagnosis: Depression Diagnosis:  Principal Problem:   Depression   Total Time spent with patient: 45 minutes  Subjective:   Paul Jennings is a 33 y.o. male patient who presented in police custody with depression which he states began a few months ago when his mother was diagnosed with stage IV cancer.  Patient reports that his mother lives in [place] and he has not been able to see her.  Patient states "I hate myself."  He endorses that he has been in jail or prison multiple times, despite completing high school working for job [employer] and other jobs.  He states he has had on several occasions thoughts of stabbing himself with a butcher knife, but he has never taken any steps to attempt to stab himself.  He does not have any history of self-harm.  Patient has never had any outpatient treatment for depressed mood.  He reports "I have been trying to talk to my friends asking for help, and no one has been able to help me."  Patient reports that he did call his mother today to let her know how he felt, and she encouraged him to seek treatment.  Patient reports that he called the police on his own accord to discuss his thoughts of wanting to stab himself, and has been brought to the emergency room via police escort.  He did present under involuntary commitment, as there is a warrant for his arrest.  Patient continues to have passive suicidal thoughts.  He denies any homicidal each ideation.  He denies any auditory or visual hallucinations.  Patient states that he can maintain his safety under escort by police to prison.  He is agreeable to starting medication for depression which he would continue when present, and seek psychiatric treatment following release.  He is resistant to  psychotherapy.  He intends to move to [place] after release from present to spend time with his mother.  Of note, blood alcohol level on arrival was significantly elevated, which certainly could contribute to depressed mood. BAL 339  Past Psychiatric History: Patient denies past psychiatric history prior to the new onset of depression  Risk to Self:  Possible, however patient only describes thoughts.  He is not described any intent and no history of self-harm despite thoughts. Risk to Others:  Denies Prior Inpatient Therapy:  No Prior Outpatient Therapy:  No  Past Medical History: History reviewed. No pertinent past medical history. History reviewed. No pertinent surgical history. Family History: No family history on file. Family Psychiatric  History: Mother with depression  Social History:  Social History   Substance and Sexual Activity  Alcohol Use Yes   Comment: 2-3 days/week     Social History   Substance and Sexual Activity  Drug Use No    Social History   Socioeconomic History  . Marital status: Single    Spouse name: Not on file  . Number of children: Not on file  . Years of education: Not on file  . Highest education level: Not on file  Occupational History  . Not on file  Social Needs  . Financial resource strain: Not on file  . Food insecurity:    Worry: Not on file    Inability: Not on file  . Transportation needs:    Medical: Not on file    Non-medical:  Not on file  Tobacco Use  . Smoking status: Current Every Day Smoker    Types: Cigarettes  . Smokeless tobacco: Never Used  Substance and Sexual Activity  . Alcohol use: Yes    Comment: 2-3 days/week  . Drug use: No  . Sexual activity: Yes    Birth control/protection: None  Lifestyle  . Physical activity:    Days per week: Not on file    Minutes per session: Not on file  . Stress: Not on file  Relationships  . Social connections:    Talks on phone: Not on file    Gets together: Not on  file    Attends religious service: Not on file    Active member of club or organization: Not on file    Attends meetings of clubs or organizations: Not on file    Relationship status: Not on file  Other Topics Concern  . Not on file  Social History Narrative  . Not on file   Additional Social History:   Patient does not state where he is living currently.  He reports that he has notified his sister with whom he is close of his situation, and she is happy that he is getting help, and agrees that he needs to serve his prison time.  Patient has requested that we do not notify his mother of his current situation.  "I do not want to upset her."   Allergies:  No Known Allergies  Labs:  Results for orders placed or performed during the hospital encounter of 06/20/18 (from the past 48 hour(s))  Comprehensive metabolic panel     Status: Abnormal   Collection Time: 06/20/18  3:18 PM  Result Value Ref Range   Sodium 139 135 - 145 mmol/L   Potassium 4.2 3.5 - 5.1 mmol/L   Chloride 100 98 - 111 mmol/L   CO2 29 22 - 32 mmol/L   Glucose, Bld 120 (H) 70 - 99 mg/dL   BUN 11 6 - 20 mg/dL   Creatinine, Ser 9.14 0.61 - 1.24 mg/dL   Calcium 9.2 8.9 - 78.2 mg/dL   Total Protein 9.5 (H) 6.5 - 8.1 g/dL   Albumin 5.1 (H) 3.5 - 5.0 g/dL   AST 51 (H) 15 - 41 U/L   ALT 31 0 - 44 U/L   Alkaline Phosphatase 75 38 - 126 U/L   Total Bilirubin 1.0 0.3 - 1.2 mg/dL   GFR calc non Af Amer >60 >60 mL/min   GFR calc Af Amer >60 >60 mL/min   Anion gap 10 5 - 15    Comment: Performed at [facility], [address], [place], [place] [address]  Ethanol     Status: Abnormal   Collection Time: 06/20/18  3:18 PM  Result Value Ref Range   Alcohol, Ethyl (B) 339 (HH) <10 mg/dL    Comment: CRITICAL RESULT CALLED TO, READ BACK BY AND VERIFIED WITH AMY VOISVERT 06/20/18 1602 KLW (NOTE) Lowest detectable limit for serum alcohol is 10 mg/dL. For medical purposes only. Performed at [facility], [address] [place], [place] [address]   Salicylate level     Status: None   Collection Time: 06/20/18  3:18 PM  Result Value Ref Range   Salicylate Lvl <7.0 2.8 - 30.0 mg/dL    Comment: Performed at [facility], [address], [place], [place] [address]  Acetaminophen level     Status: Abnormal   Collection Time: 06/20/18  3:18 PM  Result Value Ref Range   Acetaminophen (Tylenol), Serum <10 (L) 10 - 30 ug/mL    Comment: (NOTE) Therapeutic concentrations vary significantly. A range of 10-30 ug/mL  may be an effective concentration for many patients. However, some  are best treated at concentrations outside of this range. Acetaminophen concentrations >150 ug/mL at 4 hours after ingestion  and >50 ug/mL at 12 hours after ingestion are often associated with  toxic reactions. Performed at [facility], [address] [place], [place] [address]   cbc     Status: None   Collection Time: 06/20/18  3:18 PM  Result Value Ref Range   WBC 4.3 4.0 - 10.5 K/uL   RBC 4.77 4.22 - 5.81 MIL/uL   Hemoglobin 15.8 13.0 - 17.0 g/dL   HCT 66.4 40.3 - 47.4 %   MCV 97.3 80.0 - 100.0 fL   MCH 33.1 26.0 - 34.0 pg   MCHC 34.1 30.0 - 36.0 g/dL   RDW 25.9 56.3 - 87.5 %   Platelets 217 150 - 400 K/uL   nRBC 0.0 0.0 - 0.2 %    Comment: Performed at [facility], [address], [place], [place] [address]  Urine Drug Screen, Qualitative     Status: None   Collection Time: 06/20/18  3:18 PM  Result Value Ref Range   Tricyclic, Ur Screen NONE DETECTED NONE DETECTED   Amphetamines, Ur Screen NONE DETECTED NONE DETECTED   MDMA (Ecstasy)Ur Screen NONE DETECTED NONE DETECTED   Cocaine Metabolite,Ur [place] NONE DETECTED NONE DETECTED   Opiate, Ur Screen NONE DETECTED NONE DETECTED   Phencyclidine (PCP) Ur S NONE DETECTED NONE DETECTED   Cannabinoid 50 Ng, Ur [place] NONE DETECTED NONE DETECTED   Barbiturates, Ur Screen NONE DETECTED NONE DETECTED   Benzodiazepine, Ur Scrn NONE  DETECTED NONE DETECTED   Methadone Scn, Ur NONE DETECTED NONE DETECTED    Comment: (NOTE) Tricyclics + metabolites, urine    Cutoff 1000 ng/mL Amphetamines + metabolites, urine  Cutoff 1000 ng/mL MDMA (Ecstasy), urine              Cutoff 500 ng/mL Cocaine Metabolite, urine          Cutoff 300 ng/mL Opiate + metabolites, urine        Cutoff 300 ng/mL Phencyclidine (PCP), urine         Cutoff 25 ng/mL Cannabinoid, urine                 Cutoff 50 ng/mL Barbiturates + metabolites, urine  Cutoff 200 ng/mL Benzodiazepine, urine              Cutoff 200 ng/mL Methadone, urine                   Cutoff 300 ng/mL The urine drug screen provides only a preliminary, unconfirmed analytical test result and should not be used for non-medical purposes. Clinical consideration and professional judgment should be applied to any positive drug screen result due to possible interfering substances. A more specific alternate chemical method must be used in order to obtain a confirmed analytical result. Gas chromatography / mass spectrometry (GC/MS) is the preferred confirmat ory method. Performed at [facility], [address], [place], [place] [address]     Current Facility-Administered Medications  Medication Dose Route Frequency Provider Last Rate Last Dose  . FLUoxetine (PROZAC) capsule 20 mg  20 mg Oral Once Don Perking, [place], MD      . ibuprofen (ADVIL,MOTRIN) tablet 600  mg  600 mg Oral Once Nita SickleVeronese, [place], MD       Current Outpatient Medications  Medication Sig Dispense Refill  . albuterol (PROVENTIL HFA;VENTOLIN HFA) 108 (90 Base) MCG/ACT inhaler Inhale 2 puffs into the lungs every 6 (six) hours as needed for wheezing or shortness of breath. 1 Inhaler 2  . amoxicillin (AMOXIL) 500 MG capsule Take 1 capsule (500 mg total) by mouth 3 (three) times daily. 30 capsule 0  . FLUoxetine (PROZAC) 20 MG capsule Take 1 capsule (20 mg total) by mouth daily. 30 capsule 0  . lidocaine  (XYLOCAINE) 2 % solution Use as directed 5 mLs in the mouth or throat every 6 (six) hours as needed for mouth pain. 100 mL 0  . traMADol (ULTRAM) 50 MG tablet Take 1 tablet (50 mg total) by mouth every 6 (six) hours as needed for moderate pain. 12 tablet 0    Musculoskeletal: Strength & Muscle Tone: within normal limits Gait & Station: normal Patient leans: N/A  Psychiatric Specialty Exam: Physical Exam  Nursing note and vitals reviewed. Constitutional: He is oriented to person, place, and time. He appears well-developed and well-nourished. No distress.  HENT:  Head: Normocephalic and atraumatic.  Eyes: EOM are normal.  Conjunctival redness   Neck: Normal range of motion.  Cardiovascular: Normal rate.  Respiratory: Effort normal.  Musculoskeletal: Normal range of motion.  Neurological: He is alert and oriented to person, place, and time.  Skin: Skin is warm and dry.    Review of Systems  Constitutional: Negative.   HENT: Negative.   Respiratory: Negative.   Cardiovascular: Negative.   Gastrointestinal: Negative.   Musculoskeletal: Negative.   Neurological: Positive for headaches.  Psychiatric/Behavioral: Positive for depression, substance abuse (alcohol) and suicidal ideas (with thoughts of stabbing self with butcher knife, no intent and able to contract for safety to be discharged wiith police.). Negative for hallucinations and memory loss. The patient is not nervous/anxious and does not have insomnia.     Blood pressure (!) 154/85, pulse 89, temperature 98.8 F (37.1 C), temperature source Oral, resp. rate 20, SpO2 97 %.There is no height or weight on file to calculate BMI.  General Appearance: Casual and Guarded  Eye Contact:  Fair  Speech:  Clear and Coherent and Normal Rate  Volume:  Normal  Mood:  Dysphoric  Affect:  Appropriate and Blunt  Thought Process:  Goal Directed, Linear and Descriptions of Associations: Intact  Orientation:  Full (Time, Place, and Person)   Thought Content:  Logical and Hallucinations: None  Suicidal Thoughts:  Yes.  without intent/plan  Homicidal Thoughts:  No  Memory:  Fair  Judgement:  Poor  Insight:  Fair  Psychomotor Activity:  Normal  Concentration:  Concentration: Fair  Recall:  Fair  Fund of Knowledge:  Fair  Language:  Good  Akathisia:  No  Handed:  Right  AIMS (if indicated):    Not applicable  Assets:  Desire for Improvement Social Support  ADL's:  Intact  Cognition:  WNL  Sleep:   Adequate     Treatment Plan Summary: Medication management and Plan Start fluoxetine 20 mg daily for depression.  Patient can be discharged with a prescription in order for him to continue medication while in prison.  Recommend every 15-minute checks once institutionalized to ensure patient safety.  Disposition: No evidence of imminent risk to self or others at present.   Patient does not meet criteria for psychiatric inpatient admission. Supportive therapy provided about ongoing stressors. Discussed crisis  plan, support from social network, calling 911, coming to the Emergency Department, and calling Suicide Hotline. Encouraged psychotherapy and follow-up with mental health services upon release from prison.  Patient reports that he is able to maintain safety while in custody of police.  He is agreeable to follow-up care after discharge, with an intent to live with his mother in [place].   Rescind involuntary commitment into police custody for discharge.  Mariel CraftSHEILA M , MD 06/20/2018 6:00 PM   Note:  This document was prepared using Dragon voice recognition software and may include unintentional dictation errors.

## 2018-06-20 NOTE — Discharge Instructions (Addendum)
You have been seen in the Emergency Department (ED)  today for a psychiatric complaint.  You have been evaluated by psychiatry and we believe you are safe to be discharged from the hospital.   ° °Please return to the Emergency Department (ED)  immediately if you have ANY thoughts of hurting yourself or anyone else, so that we may help you. ° °Please avoid alcohol and drug use. ° °Follow up with your doctor and/or therapist as soon as possible regarding today's ED  visit.  ° °You may call crisis hotline for [place] at [phone]. ° °

## 2018-06-20 NOTE — ED Notes (Signed)
Pt calm and cooperative. Pt endorses SI with a plan to stab himself or get a gun. Pt reports he wants to go to [place] to be with his mother who has stage 4 cancer.  Pt stated he has asked his friends for help, but is never taken seriously.   Maintained on 15 minute checks and observation by security for safety.

## 2018-07-07 IMAGING — CR DG CHEST 2V
2 series · 2 of 2 positions shown · non-contrast
Comparison: 02/22/2014

CLINICAL DATA: Dyspnea times several days.

EXAM:
CHEST  2 VIEW

[chest pa]
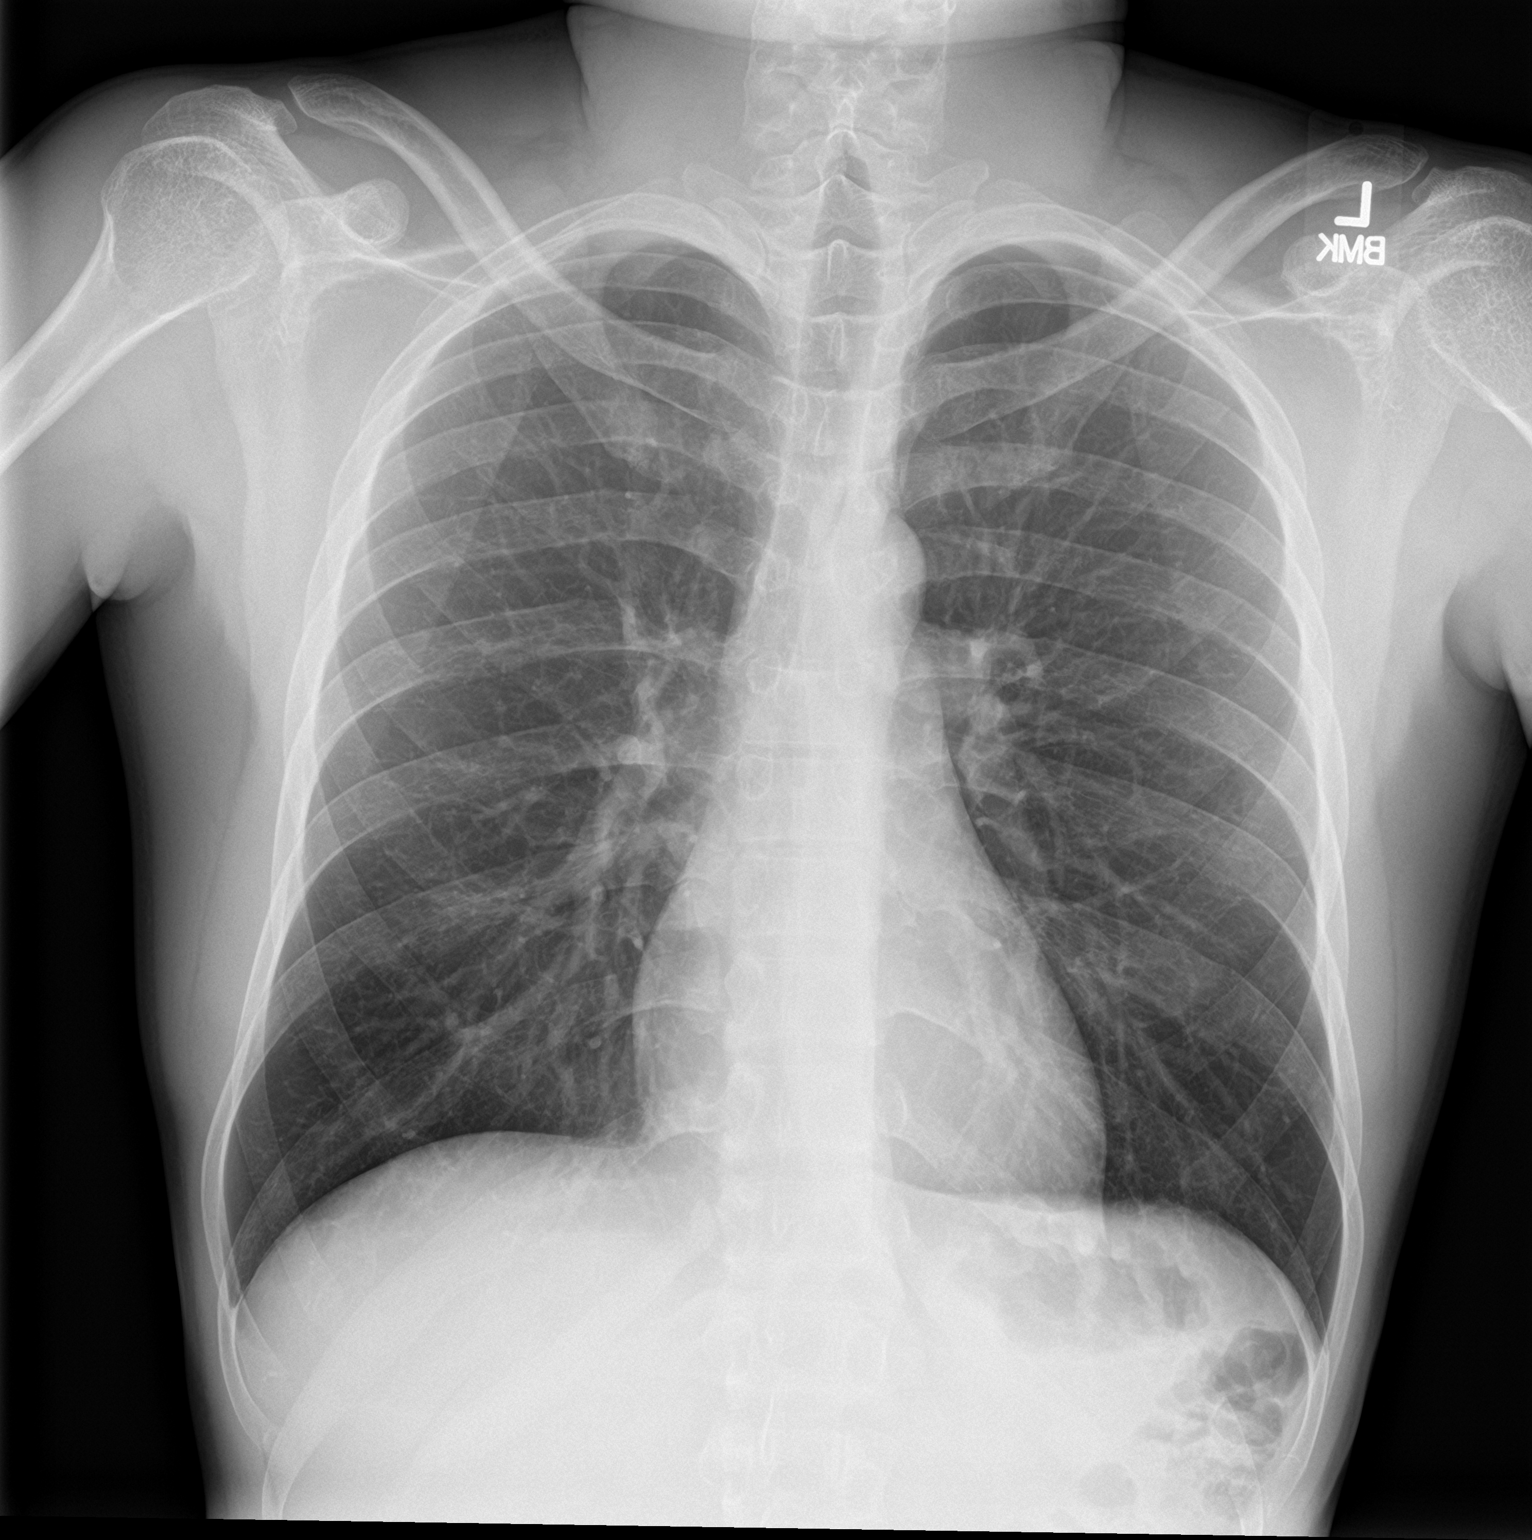

[chest lat]
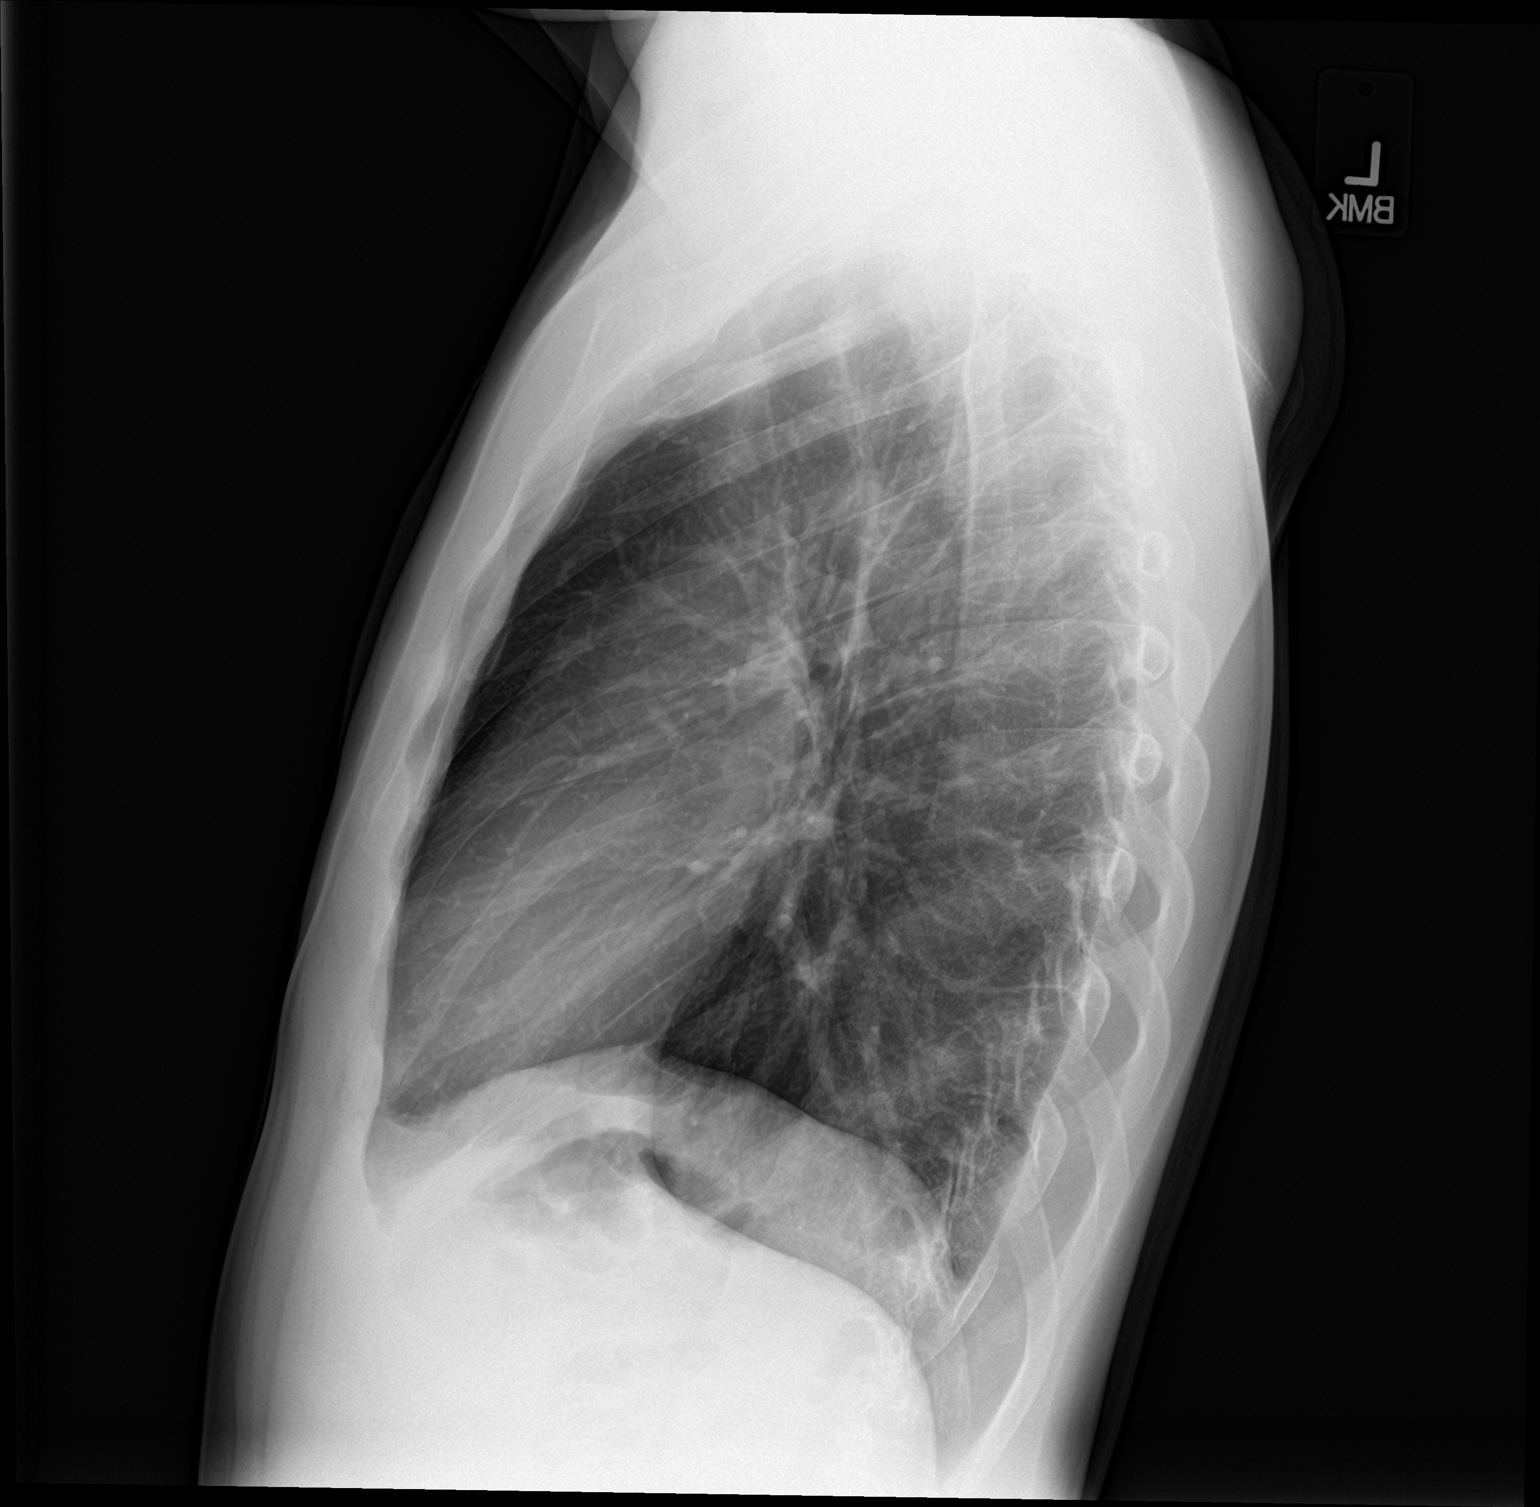

[2 of 2 positions shown; findings below may reference images not displayed]

FINDINGS: The heart size and mediastinal contours are within normal limits.
Both lungs are clear. The visualized skeletal structures are
unremarkable.
IMPRESSION: No active cardiopulmonary disease.

## 2021-05-30 DEATH — deceased
